# Patient Record
Sex: Female | Born: 1955
Health system: Southern US, Community
[De-identification: ages and names within clinical notes are randomized; demographics above are authoritative.]

## PROBLEM LIST (undated history)

## (undated) DIAGNOSIS — I10 Essential (primary) hypertension: Secondary | ICD-10-CM

---

## 2017-11-29 ENCOUNTER — Emergency Department (HOSPITAL_COMMUNITY): Payer: BC Managed Care – PPO

## 2017-11-29 ENCOUNTER — Encounter (HOSPITAL_COMMUNITY): Payer: Self-pay | Admitting: Emergency Medicine

## 2017-11-29 ENCOUNTER — Other Ambulatory Visit: Payer: Self-pay

## 2017-11-29 ENCOUNTER — Emergency Department (HOSPITAL_COMMUNITY)
Admission: EM | Admit: 2017-11-29 | Discharge: 2017-11-29 | Disposition: A | Discharge status: Home / Self Care | Payer: BC Managed Care – PPO | Attending: Emergency Medicine | Admitting: Emergency Medicine

## 2017-11-29 DIAGNOSIS — R002 Palpitations: Secondary | ICD-10-CM | POA: Insufficient documentation

## 2017-11-29 DIAGNOSIS — Z79899 Other long term (current) drug therapy: Secondary | ICD-10-CM | POA: Insufficient documentation

## 2017-11-29 DIAGNOSIS — I10 Essential (primary) hypertension: Secondary | ICD-10-CM | POA: Insufficient documentation

## 2017-11-29 HISTORY — DX: Essential (primary) hypertension: I10

## 2017-11-29 LAB — CBC
HCT: 44.1 % (ref 36.0–46.0)
Hemoglobin: 14.7 g/dL (ref 12.0–15.0)
MCH: 31.8 pg (ref 26.0–34.0)
MCHC: 33.3 g/dL (ref 30.0–36.0)
MCV: 95.5 fL (ref 78.0–100.0)
PLATELETS: 155 10*3/uL (ref 150–400)
RBC: 4.62 MIL/uL (ref 3.87–5.11)
RDW: 13.4 % (ref 11.5–15.5)
WBC: 7 10*3/uL (ref 4.0–10.5)

## 2017-11-29 LAB — TSH: TSH: 2.012 u[IU]/mL (ref 0.350–4.500)

## 2017-11-29 LAB — BASIC METABOLIC PANEL
Anion gap: 11 (ref 5–15)
BUN: 15 mg/dL (ref 6–20)
CALCIUM: 9.6 mg/dL (ref 8.9–10.3)
CO2: 22 mmol/L (ref 22–32)
CREATININE: 0.87 mg/dL (ref 0.44–1.00)
Chloride: 108 mmol/L (ref 101–111)
GFR calc non Af Amer: >60 mL/min (ref >60)
Glucose, Bld: 152 mg/dL — ABNORMAL HIGH (ref 65–99)
Potassium: 4.3 mmol/L (ref 3.5–5.1)
Sodium: 141 mmol/L (ref 135–145)

## 2017-11-29 LAB — I-STAT TROPONIN, ED: TROPONIN I, POC: 0 ng/mL (ref 0.00–0.08)

## 2017-11-29 LAB — D-DIMER, QUANTITATIVE (NOT AT ARMC): D DIMER QUANT: 0.43 ug{FEU}/mL (ref 0.00–0.50)

## 2017-11-29 NOTE — ED Provider Notes (Signed)
[facility]-EMERGENCY DEPT Provider Note   CSN: [id number] Arrival date & time: 11/29/17  0457     History   Chief Complaint Chief Complaint  Patient presents with  . Palpitations    HPI Amanda Bray is a 62 y.o. female.  Patient presents with a sensation of a rapid pulse in the middle the night lasting for several hours.  No frank substernal chest pain, dyspnea, diaphoresis, nausea.  She has had several of these spells over the years, but no specific diagnosis has been made.  Past medical history includes hypertension, prediabetes, obesity, breast mass.  She is on antihypertensive medications.  No smoking.  One alcoholic beverages per day.  She had an echocardiogram and stress test 6 years ago with uncertain results.  She is from [place] and in [place] for a bridge tournament.  No prodromal illnesses.  Severity of symptoms is moderate.  Nothing makes symptoms better or worse.      Past Medical History:  Diagnosis Date  . Hypertension     There are no active problems to display for this patient.   History reviewed. No pertinent surgical history.  OB History    No data available       Home Medications    Prior to Admission medications   Medication Sig Start Date End Date Taking? Authorizing Provider  amLODipine (NORVASC) 5 MG tablet Take 5 mg by mouth every evening.  11/10/17  Yes [provider]  Cholecalciferol (VITAMIN D3) 50000 units CAPS Take 1 capsule by mouth once a week. 11/03/17  Yes [provider]  clobetasol (TEMOVATE) 0.05 % external solution Apply 1 application topically 2 (two) times daily. 09/03/17  Yes [provider]  fluticasone (FLONASE) 50 MCG/ACT nasal spray Place 2 sprays into both nostrils daily as needed for allergies or rhinitis.  10/03/17  Yes [provider]  mometasone (ELOCON) 0.1 % cream Apply 1 application topically daily as needed (scaling).  09/03/17  Yes [provider]  nortriptyline (PAMELOR) 10 MG capsule Take 20 mg by mouth at bedtime. 11/12/17  Yes [provider]  ranitidine (ZANTAC) 150 MG tablet Take 150 mg by mouth every evening. 11/26/17  Yes [provider]  telmisartan (MICARDIS) 40 MG tablet Take 40 mg by mouth every evening.  11/10/17  Yes [provider]    Family History History reviewed. No pertinent family history.  Social History Social History   Tobacco Use  . Smoking status: Never Smoker  . Smokeless tobacco: Never Used  Substance Use Topics  . Alcohol use: No    Frequency: Never  . Drug use: No     Allergies   Patient has no known allergies.   Review of Systems Review of Systems  All other systems reviewed and are negative.    Physical Exam Updated Vital Signs BP (!) 142/79 (BP Location: Left Arm)   Pulse 85   Temp 98.1 F (36.7 C) (Oral)   Resp 17   Ht 5\' 8"  (1.727 m)   Wt 113.4 kg (250 lb)   SpO2 100%   BMI 38.01 kg/m   Physical Exam  Constitutional: She is oriented to person, place, and time. She appears well-developed and well-nourished.  HENT:  Head: Normocephalic and atraumatic.  Eyes: Conjunctivae are normal.  Neck: Neck supple.  Cardiovascular: Regular rhythm.  Initially tachycardic, but pulse normalized over time.  Pulmonary/Chest: Effort normal and breath sounds normal.  Abdominal: Soft. Bowel sounds are normal.  Musculoskeletal: Normal  range of motion.  Neurological: She is alert and oriented to person, place, and time.  Skin: Skin is warm and dry.  Psychiatric: She has a normal mood and affect. Her behavior is normal.  Nursing note and vitals reviewed.    ED Treatments / Results  Labs (all labs ordered are listed, but only abnormal results are displayed) Labs Reviewed  BASIC METABOLIC PANEL - Abnormal; Notable for the following components:      Result Value   Glucose, Bld 152 (*)    All other components within normal limits  CBC  TSH    D-DIMER, QUANTITATIVE (NOT AT [facility])  I-STAT TROPONIN, ED    EKG  EKG Interpretation  Date/Time:  Saturday November 29 2017 05:46:35 EDT Ventricular Rate:  112 PR Interval:    QRS Duration: 84 QT Interval:  313 QTC Calculation: 428 R Axis:   50 Text Interpretation:  Age not entered, assumed to be  62 years old for purpose of ECG interpretation Sinus tachycardia Low voltage, precordial leads Confirmed by Donnetta Hutchingook, Tyberius Ryner ([id number]) on 11/29/2017 11:56:28 AM       Radiology Dg Chest 2 View  Result Date: 11/29/2017 CLINICAL DATA:  Acute onset of heart palpitations. EXAM: CHEST - 2 VIEW COMPARISON:  None. FINDINGS: The lungs are well-aerated and clear. There is no evidence of focal opacification, pleural effusion or pneumothorax. The heart is normal in size; the mediastinal contour is within normal limits. No acute osseous abnormalities are seen. IMPRESSION: No acute cardiopulmonary process seen. Electronically Signed   By: Roanna RaiderJeffery  Chang M.D.   On: 11/29/2017 06:18    Procedures Procedures (including critical care time)  Medications Ordered in ED Medications - No data to display   Initial Impression / Assessment and Plan / ED Course  I have reviewed the triage vital signs and the nursing notes.  Pertinent labs & imaging results that were available during my care of the patient were reviewed by me and considered in my medical decision making (see chart for details).     Patient presents with a sensation of tachycardia and palpitations.  She is hemodynamically stable.  Labs, EKG, troponin, d-dimer, chest x-ray all acceptable with the exception of a minimally elevated glucose.  These test results were discussed with the patient and her friend.  She will follow-up with her cardiologist at home.  Final Clinical Impressions(s) / ED Diagnoses   Final diagnoses:  Palpitations    ED Discharge Orders    None       Donnetta Hutchingook, Analeya Luallen, MD 11/29/17 1418

## 2017-11-29 NOTE — Discharge Instructions (Signed)
Glucose was slightly elevated at 152.  Otherwise tests were normal.  Follow-up with your cardiologist at home.  Recommend a Holter monitor for further evaluation.

## 2018-11-30 IMAGING — CR DG CHEST 2V
2 series · 2 of 2 positions shown · non-contrast
Comparison: None.

CLINICAL DATA: Acute onset of heart palpitations.

EXAM:
CHEST - 2 VIEW

[w chest pa]
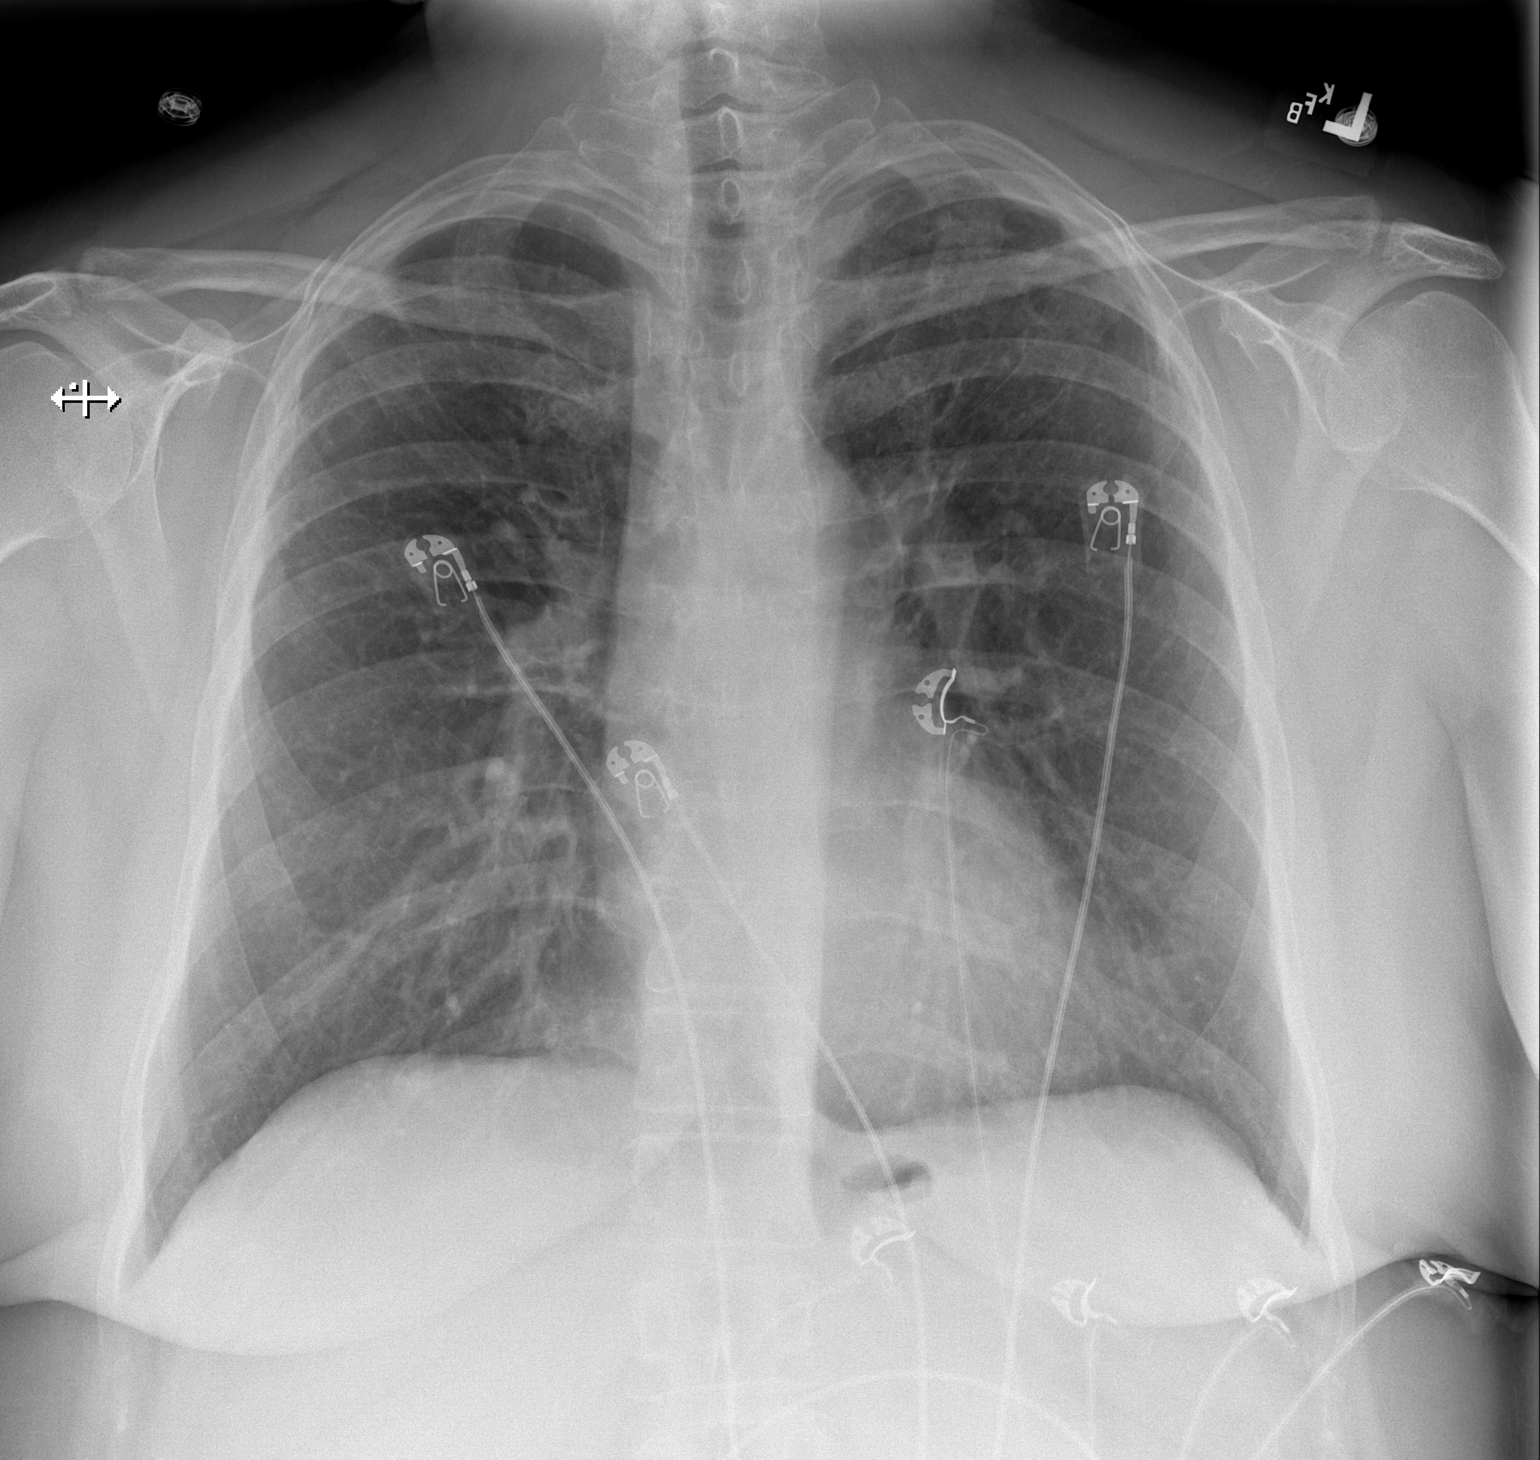

[w chest lat]
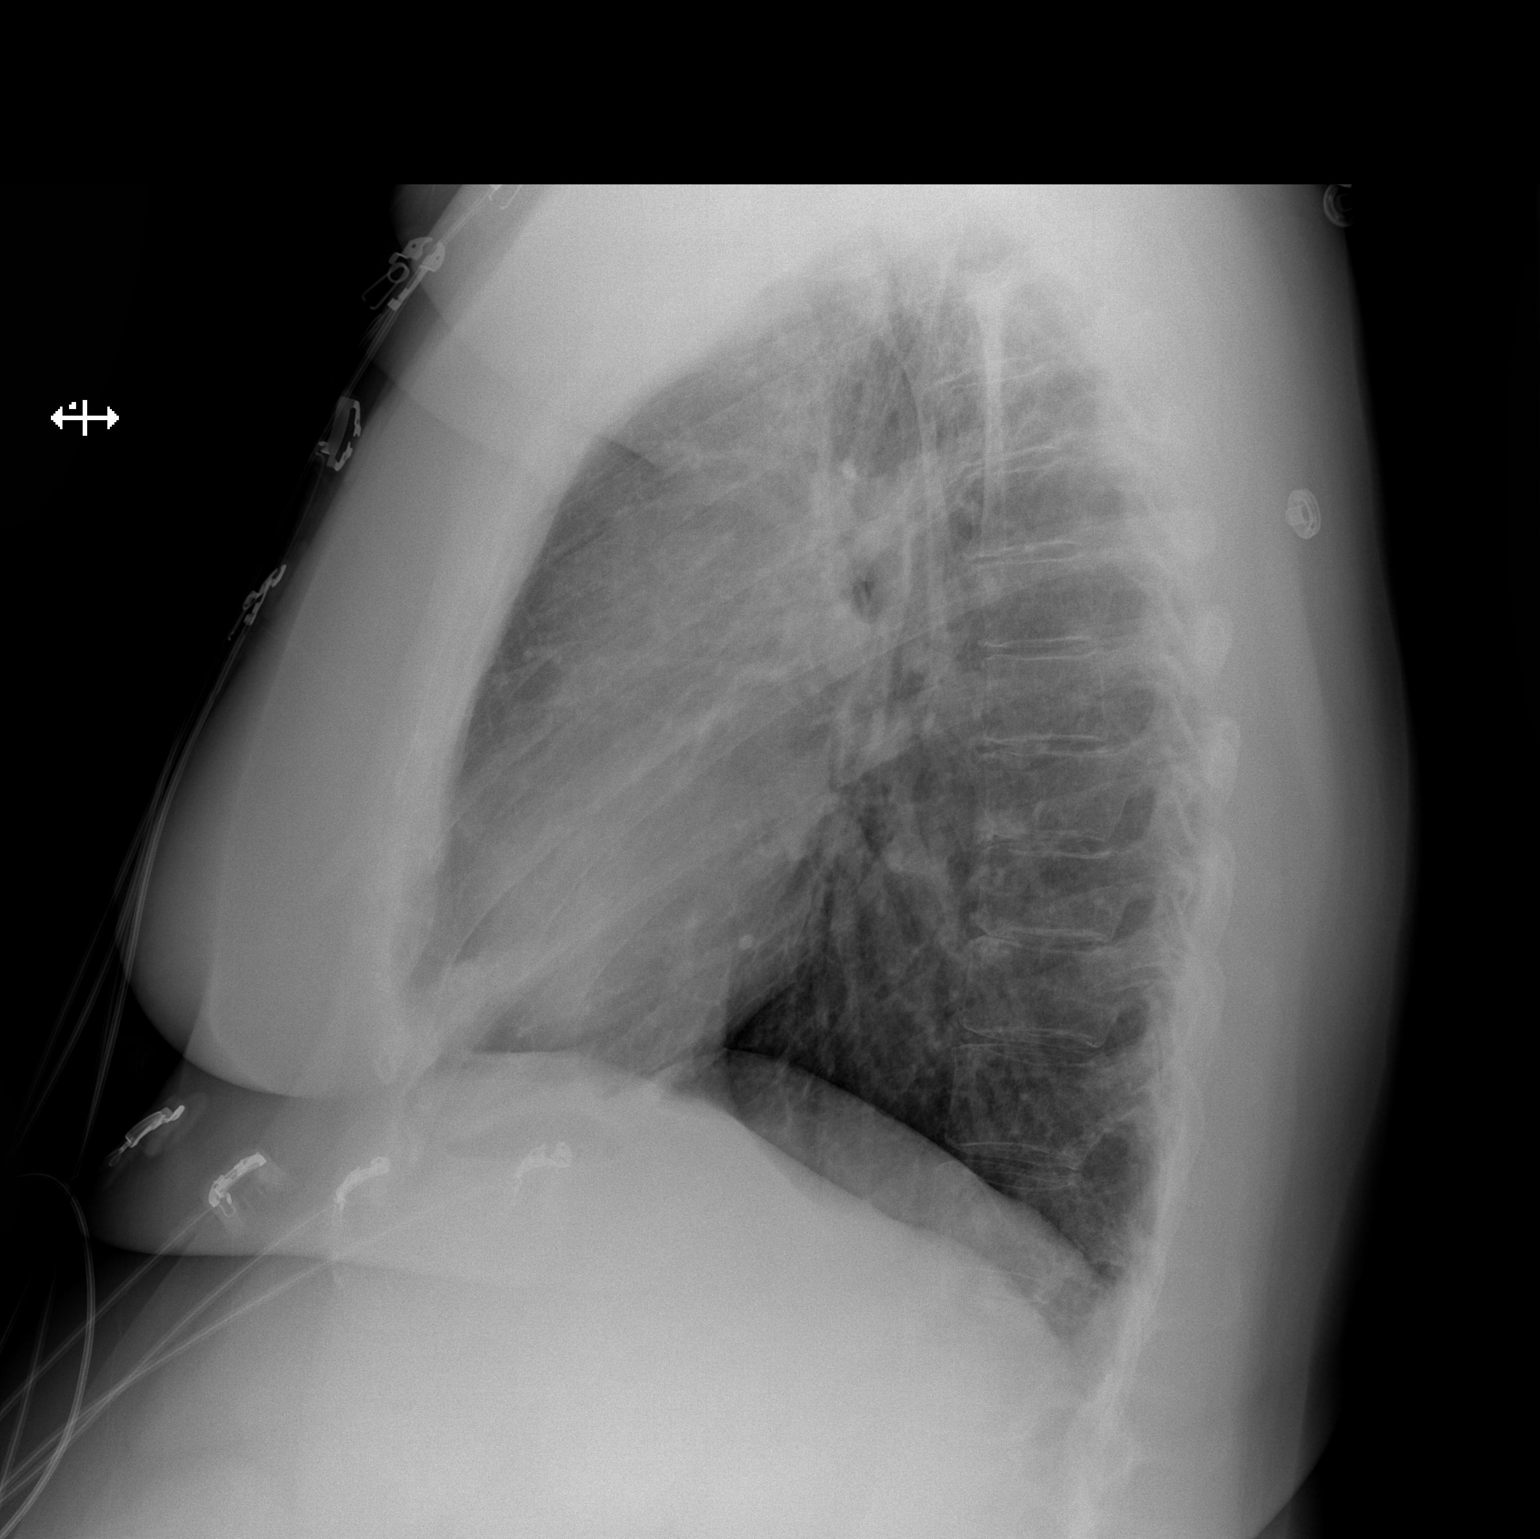

[2 of 2 positions shown; findings below may reference images not displayed]

FINDINGS: The lungs are well-aerated and clear. There is no evidence of focal
opacification, pleural effusion or pneumothorax.

The heart is normal in size; the mediastinal contour is within
normal limits. No acute osseous abnormalities are seen.
IMPRESSION: No acute cardiopulmonary process seen.
